# Patient Record
Sex: Male | Born: 1980 | Race: White | Hispanic: No | Marital: Single
Health system: Southern US, Community
[De-identification: ages and names within clinical notes are randomized; demographics above are authoritative.]

## PROBLEM LIST (undated history)

## (undated) DIAGNOSIS — I10 Essential (primary) hypertension: Secondary | ICD-10-CM

---

## 2004-06-03 ENCOUNTER — Inpatient Hospital Stay: Payer: Self-pay | Admitting: Anesthesiology

## 2005-02-19 ENCOUNTER — Inpatient Hospital Stay: Payer: Self-pay | Admitting: Unknown Physician Specialty

## 2006-09-22 ENCOUNTER — Emergency Department: Payer: Self-pay | Admitting: Emergency Medicine

## 2006-12-25 ENCOUNTER — Other Ambulatory Visit: Payer: Self-pay

## 2006-12-25 ENCOUNTER — Inpatient Hospital Stay: Payer: Self-pay | Admitting: Internal Medicine

## 2008-07-19 ENCOUNTER — Emergency Department: Payer: Self-pay | Admitting: Emergency Medicine

## 2009-07-08 ENCOUNTER — Emergency Department: Payer: Self-pay | Admitting: Emergency Medicine

## 2010-04-08 ENCOUNTER — Emergency Department: Payer: Self-pay | Admitting: Unknown Physician Specialty

## 2012-06-03 ENCOUNTER — Emergency Department: Payer: Self-pay | Admitting: Emergency Medicine

## 2012-06-03 LAB — COMPREHENSIVE METABOLIC PANEL
Alkaline Phosphatase: 96 U/L (ref 50–136)
Anion Gap: 8 (ref 7–16)
Bilirubin,Total: 0.4 mg/dL (ref 0.2–1.0)
Chloride: 95 mmol/L — ABNORMAL LOW (ref 98–107)
Co2: 29 mmol/L (ref 21–32)
Glucose: 97 mg/dL (ref 65–99)
Osmolality: 262 (ref 275–301)
Potassium: 3.2 mmol/L — ABNORMAL LOW (ref 3.5–5.1)
Sodium: 132 mmol/L — ABNORMAL LOW (ref 136–145)

## 2012-06-03 LAB — CBC WITH DIFFERENTIAL/PLATELET
Basophil #: 0.1 10*3/uL (ref 0.0–0.1)
Eosinophil #: 0 10*3/uL (ref 0.0–0.7)
Lymphocyte #: 1.7 10*3/uL (ref 1.0–3.6)
Lymphocyte %: 11.1 %
MCHC: 34.9 g/dL (ref 32.0–36.0)
MCV: 103 fL — ABNORMAL HIGH (ref 80–100)
Monocyte #: 1.4 x10 3/mm — ABNORMAL HIGH (ref 0.2–1.0)
Monocyte %: 9.5 %
Neutrophil #: 12 10*3/uL — ABNORMAL HIGH (ref 1.4–6.5)
Platelet: 211 10*3/uL (ref 150–440)

## 2012-06-03 LAB — PROTIME-INR: Prothrombin Time: 12.9 secs (ref 11.5–14.7)

## 2012-07-17 IMAGING — CR DG CHEST 1V PORT
1 series · 1 of 1 positions shown · non-contrast
Comparison: none

REASON FOR EXAM: vomiting abd pain
COMMENTS:

PROCEDURE:     DXR - DXR PORTABLE CHEST SINGLE VIEW  - April 08, 2010  [DATE]
RESULT:     Comparison: None

[view not recorded]
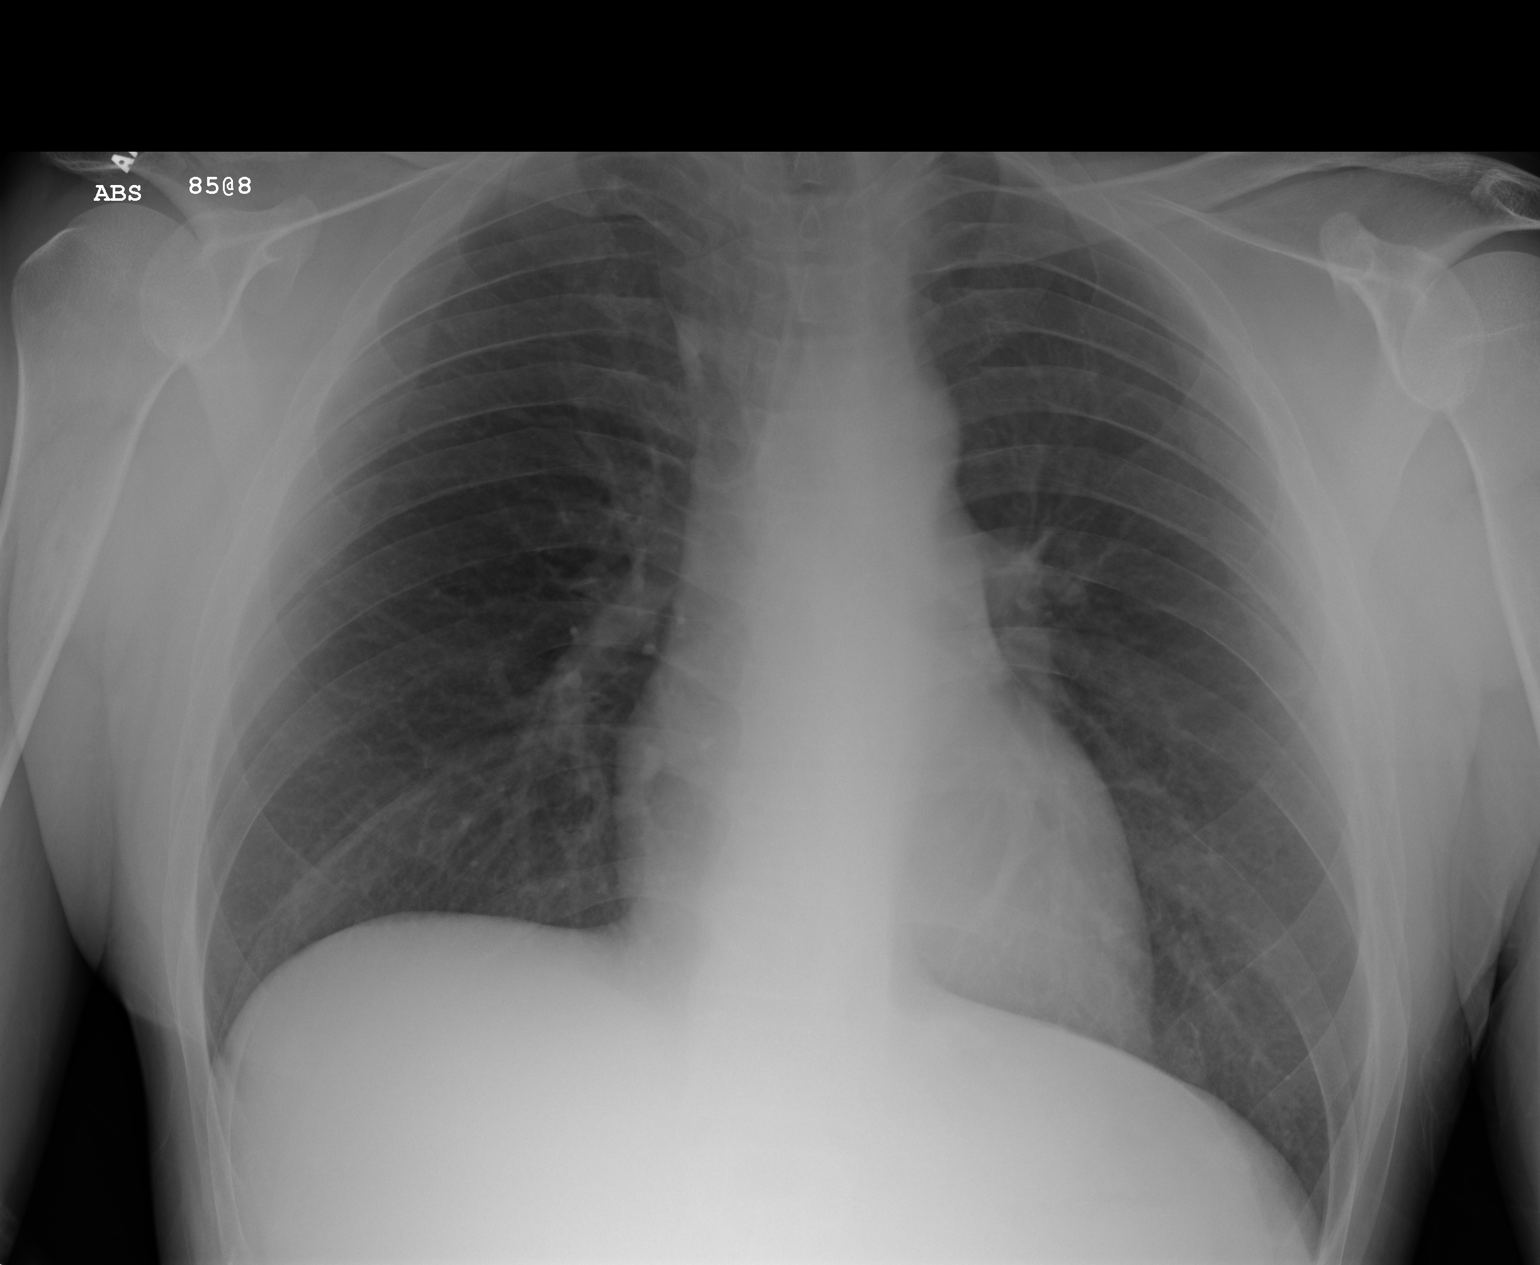

[1 of 1 positions shown; findings below may reference images not displayed]

FINDINGS: Single portable AP chest radiograph is provided.  There is no focal
parenchymal opacity, pleural effusion, or pneumothorax. Normal
cardiomediastinal silhouette. The osseous structures are unremarkable.
IMPRESSION: No acute disease of the chest.

## 2013-06-03 ENCOUNTER — Emergency Department: Payer: Self-pay | Admitting: Internal Medicine

## 2013-08-31 ENCOUNTER — Emergency Department: Payer: Self-pay | Admitting: Emergency Medicine

## 2013-08-31 LAB — URINALYSIS, COMPLETE
BACTERIA: NONE SEEN
BILIRUBIN, UR: NEGATIVE
BLOOD: NEGATIVE
GLUCOSE, UR: NEGATIVE mg/dL (ref 0–75)
Hyaline Cast: 1
Leukocyte Esterase: NEGATIVE
NITRITE: NEGATIVE
Ph: 7 (ref 4.5–8.0)
Protein: 100
RBC,UR: <1 /HPF (ref 0–5)
Specific Gravity: 1.03 (ref 1.003–1.030)
WBC UR: <1 /HPF (ref 0–5)

## 2013-08-31 LAB — CBC WITH DIFFERENTIAL/PLATELET
BASOS ABS: 0 10*3/uL (ref 0.0–0.1)
Basophil %: 0.4 %
EOS PCT: 0.4 %
Eosinophil #: 0 10*3/uL (ref 0.0–0.7)
HCT: 49.2 % (ref 40.0–52.0)
HGB: 16.8 g/dL (ref 13.0–18.0)
Lymphocyte #: 1 10*3/uL (ref 1.0–3.6)
Lymphocyte %: 10.9 %
MCH: 36.1 pg — ABNORMAL HIGH (ref 26.0–34.0)
MCHC: 34.2 g/dL (ref 32.0–36.0)
MCV: 105 fL — ABNORMAL HIGH (ref 80–100)
Monocyte #: 0.9 x10 3/mm (ref 0.2–1.0)
Monocyte %: 9.7 %
NEUTROS ABS: 7.5 10*3/uL — AB (ref 1.4–6.5)
NEUTROS PCT: 78.6 %
Platelet: 185 10*3/uL (ref 150–440)
RBC: 4.67 10*6/uL (ref 4.40–5.90)
RDW: 12.7 % (ref 11.5–14.5)
WBC: 9.5 10*3/uL (ref 3.8–10.6)

## 2013-08-31 LAB — COMPREHENSIVE METABOLIC PANEL
ALT: 45 U/L (ref 12–78)
ANION GAP: 3 — AB (ref 7–16)
Albumin: 4.4 g/dL (ref 3.4–5.0)
Alkaline Phosphatase: 124 U/L — ABNORMAL HIGH
BILIRUBIN TOTAL: 1.1 mg/dL — AB (ref 0.2–1.0)
BUN: 11 mg/dL (ref 7–18)
CO2: 32 mmol/L (ref 21–32)
CREATININE: 0.55 mg/dL — AB (ref 0.60–1.30)
Calcium, Total: 10 mg/dL (ref 8.5–10.1)
Chloride: 98 mmol/L (ref 98–107)
EGFR (African American): >60
EGFR (Non-African Amer.): >60
GLUCOSE: 98 mg/dL (ref 65–99)
Osmolality: 266 (ref 275–301)
POTASSIUM: 4.5 mmol/L (ref 3.5–5.1)
SGOT(AST): 60 U/L — ABNORMAL HIGH (ref 15–37)
SODIUM: 133 mmol/L — AB (ref 136–145)
Total Protein: 8.8 g/dL — ABNORMAL HIGH (ref 6.4–8.2)

## 2013-08-31 LAB — LIPASE, BLOOD: Lipase: 1351 U/L — ABNORMAL HIGH (ref 73–393)

## 2013-09-01 ENCOUNTER — Ambulatory Visit: Payer: Self-pay | Admitting: Internal Medicine

## 2013-09-01 LAB — PROTIME-INR
INR: 0.9
Prothrombin Time: 11.6 secs (ref 11.5–14.7)

## 2013-09-01 LAB — AFP TUMOR MARKER: AFP TUMOR MARKER: 2.3 ng/mL (ref 0.0–8.3)

## 2013-09-01 LAB — CEA: CEA: 4.7 ng/mL (ref 0.0–4.7)

## 2013-09-05 LAB — PROT IMMUNOELECTROPHORES(ARMC)

## 2013-09-09 ENCOUNTER — Ambulatory Visit: Payer: Self-pay | Admitting: Internal Medicine

## 2014-03-27 ENCOUNTER — Emergency Department: Payer: Self-pay | Admitting: Emergency Medicine

## 2014-03-27 LAB — CBC
HCT: 45.6 % (ref 40.0–52.0)
HGB: 15.5 g/dL (ref 13.0–18.0)
MCH: 36.4 pg — ABNORMAL HIGH (ref 26.0–34.0)
MCHC: 33.9 g/dL (ref 32.0–36.0)
MCV: 107 fL — AB (ref 80–100)
Platelet: 241 10*3/uL (ref 150–440)
RBC: 4.25 10*6/uL — AB (ref 4.40–5.90)
RDW: 12.6 % (ref 11.5–14.5)
WBC: 6.3 10*3/uL (ref 3.8–10.6)

## 2014-03-27 LAB — ACETAMINOPHEN LEVEL: Acetaminophen: 39 ug/mL — ABNORMAL HIGH

## 2014-03-27 LAB — COMPREHENSIVE METABOLIC PANEL
ALK PHOS: 98 U/L
ALT: 29 U/L
AST: 35 U/L (ref 15–37)
Albumin: 4.1 g/dL (ref 3.4–5.0)
Anion Gap: 8 (ref 7–16)
BILIRUBIN TOTAL: 0.4 mg/dL (ref 0.2–1.0)
BUN: 5 mg/dL — ABNORMAL LOW (ref 7–18)
CHLORIDE: 96 mmol/L — AB (ref 98–107)
CREATININE: 0.87 mg/dL (ref 0.60–1.30)
Calcium, Total: 8.1 mg/dL — ABNORMAL LOW (ref 8.5–10.1)
Co2: 26 mmol/L (ref 21–32)
EGFR (Non-African Amer.): >60
GLUCOSE: 88 mg/dL (ref 65–99)
OSMOLALITY: 257 (ref 275–301)
Potassium: 3.9 mmol/L (ref 3.5–5.1)
Sodium: 130 mmol/L — ABNORMAL LOW (ref 136–145)
TOTAL PROTEIN: 7.9 g/dL (ref 6.4–8.2)

## 2014-03-27 LAB — ETHANOL: ETHANOL LVL: 231 mg/dL

## 2014-03-27 LAB — SALICYLATE LEVEL: Salicylates, Serum: 3.2 mg/dL — ABNORMAL HIGH

## 2014-03-28 LAB — URINALYSIS, COMPLETE
Bacteria: NONE SEEN
Bilirubin,UR: NEGATIVE
Blood: NEGATIVE
GLUCOSE, UR: NEGATIVE mg/dL (ref 0–75)
Ketone: NEGATIVE
LEUKOCYTE ESTERASE: NEGATIVE
NITRITE: NEGATIVE
Ph: 6 (ref 4.5–8.0)
Protein: NEGATIVE
RBC,UR: NONE SEEN /HPF (ref 0–5)
SQUAMOUS EPITHELIAL: NONE SEEN
Specific Gravity: 1.002 (ref 1.003–1.030)
WBC UR: NONE SEEN /HPF (ref 0–5)

## 2014-03-28 LAB — DRUG SCREEN, URINE

## 2014-03-28 LAB — ACETAMINOPHEN LEVEL

## 2014-03-28 LAB — ETHANOL: Ethanol: 3 mg/dL

## 2015-09-12 IMAGING — CR DG CHEST 2V
1 series · 3 of 3 positions shown · non-contrast
Comparison: 06/03/2012

CLINICAL DATA: Cough and wheezing.  Weakness for 1 week.

EXAM:
CHEST  2 VIEW

[Series 1: pa · 0.17mm/px · 3 of 3 slices shown]
[im 1/3]
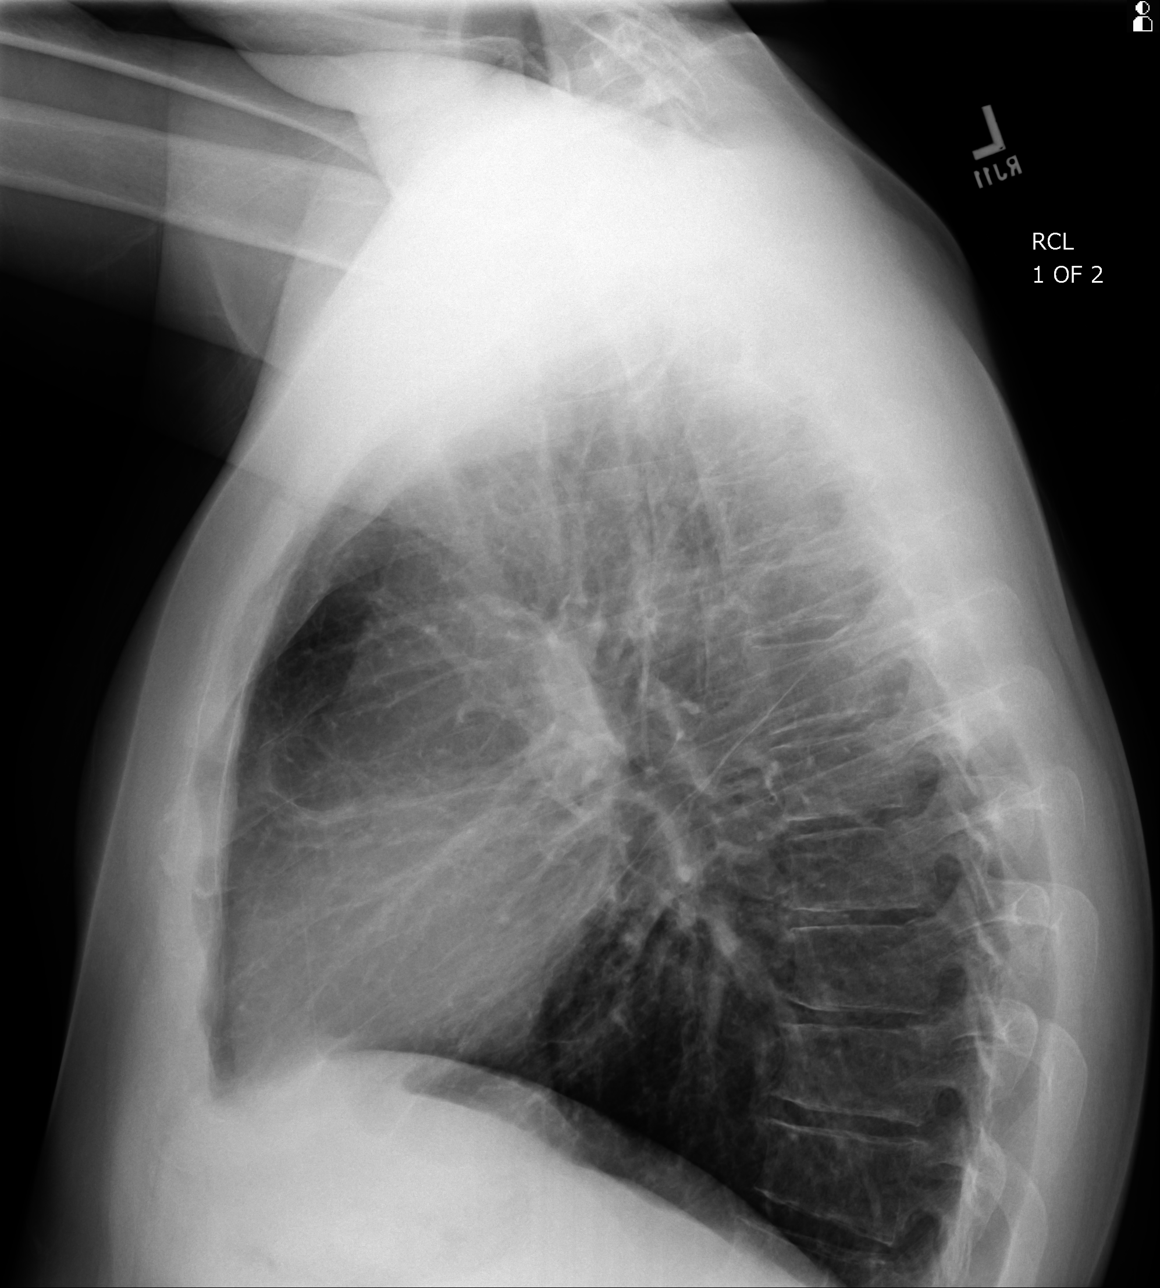
[im 2/3]
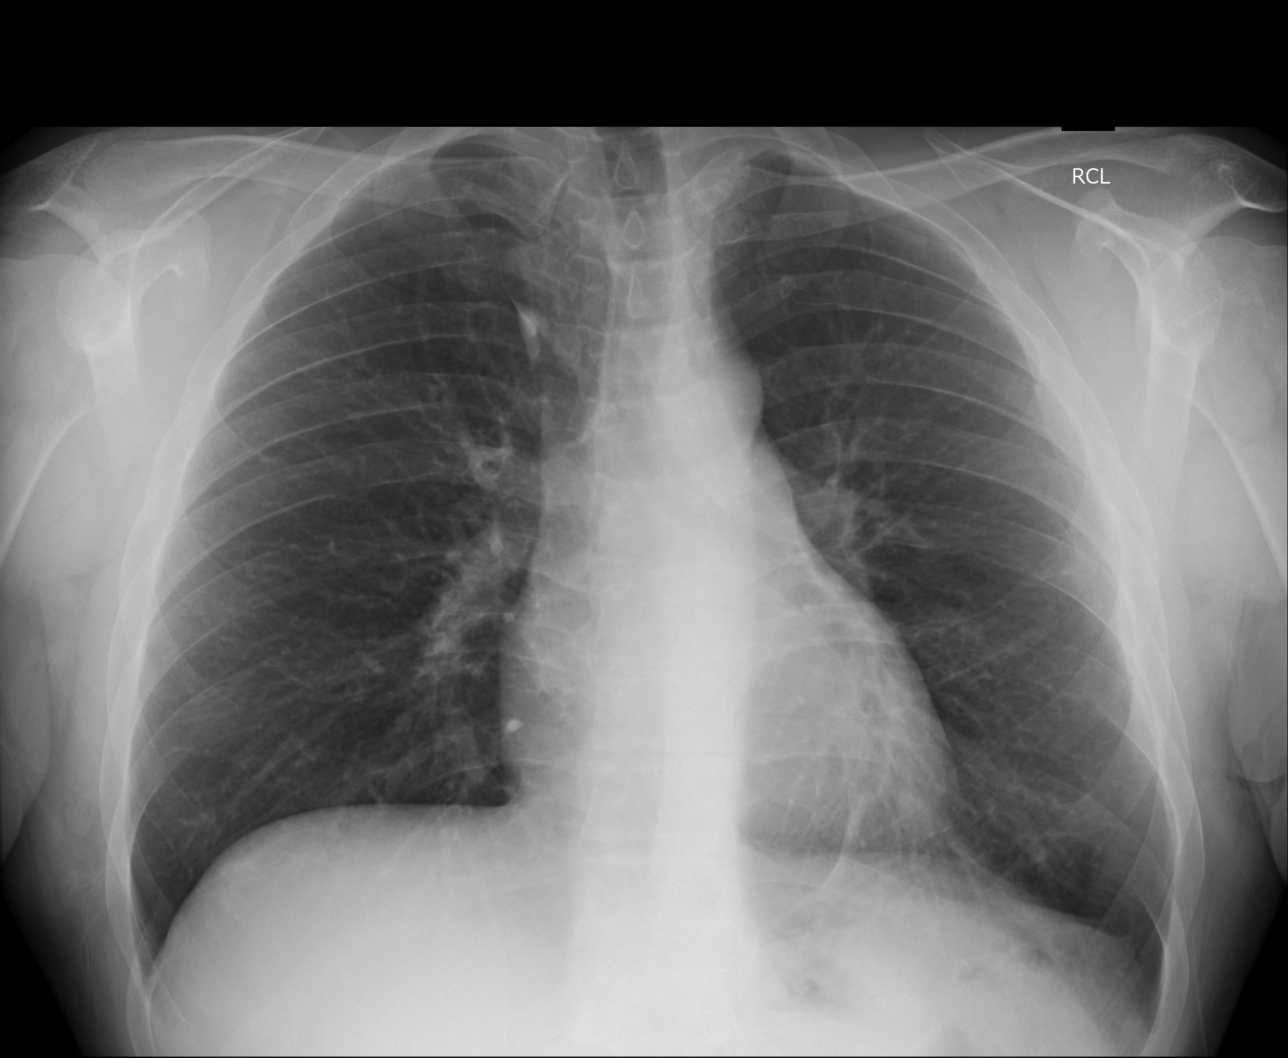
[im 3/3]
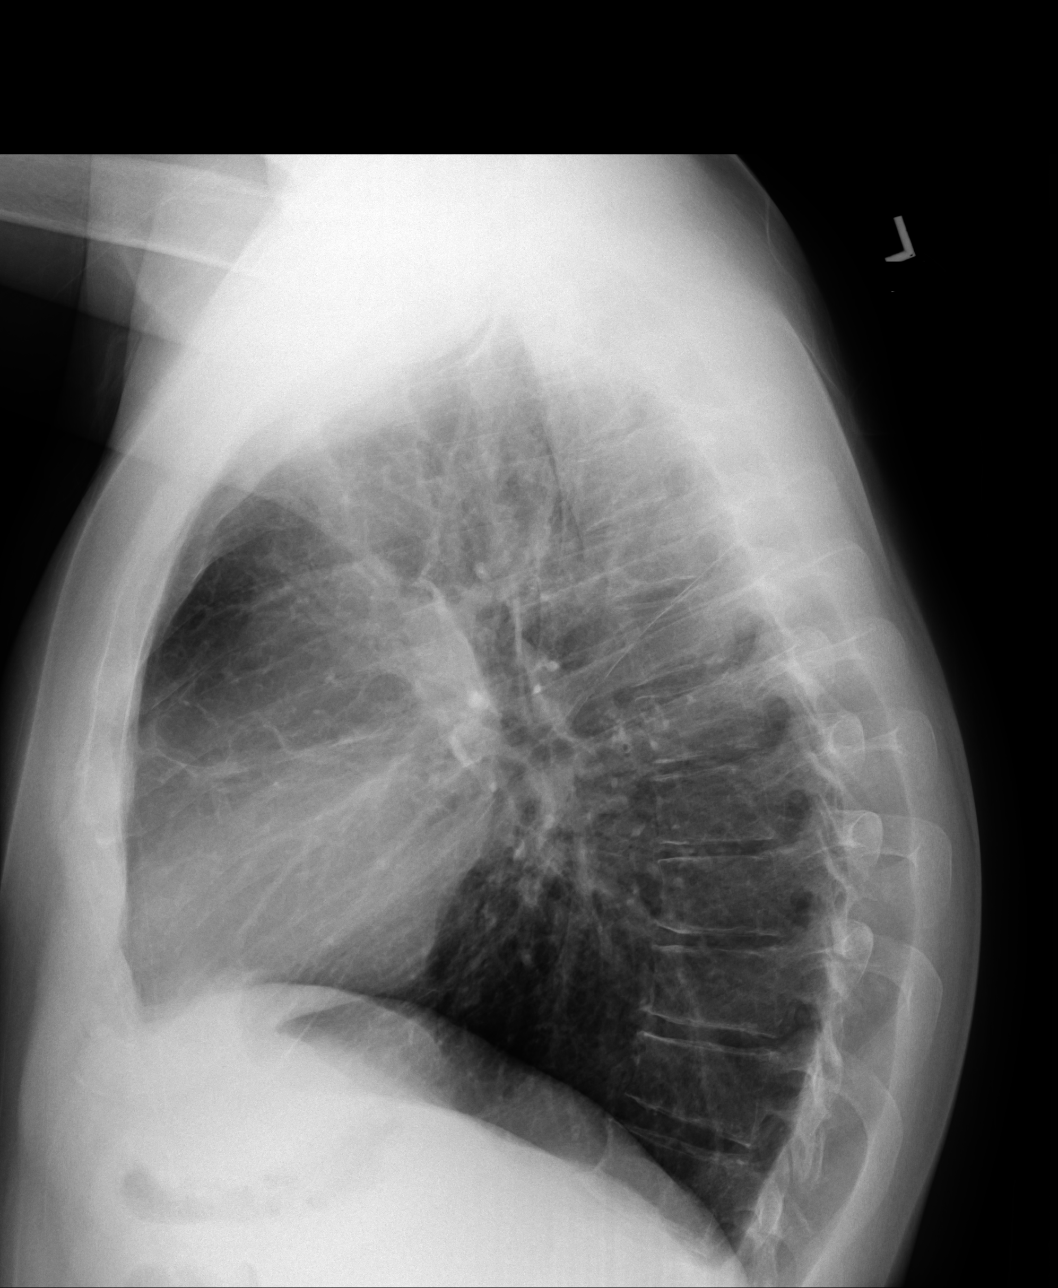

[3 of 3 positions shown; findings below may reference images not displayed]

FINDINGS: The cardiomediastinal silhouette is within normal limits. The lungs
are well inflated and clear. There is no evidence of pleural
effusion or pneumothorax. No acute osseous abnormality is
identified.
IMPRESSION: Clear lungs.

## 2015-12-29 ENCOUNTER — Emergency Department
Admission: EM | Admit: 2015-12-29 | Discharge: 2016-01-11 | Disposition: E | Payer: Self-pay | Attending: Emergency Medicine | Admitting: Emergency Medicine

## 2015-12-29 ENCOUNTER — Encounter: Payer: Self-pay | Admitting: Emergency Medicine

## 2015-12-29 DIAGNOSIS — I469 Cardiac arrest, cause unspecified: Secondary | ICD-10-CM | POA: Insufficient documentation

## 2015-12-29 HISTORY — DX: Essential (primary) hypertension: I10

## 2015-12-29 LAB — COMPREHENSIVE METABOLIC PANEL
ALBUMIN: 3.8 g/dL (ref 3.5–5.0)
ALK PHOS: 96 U/L (ref 38–126)
ALT: 32 U/L (ref 17–63)
ANION GAP: 13 (ref 5–15)
AST: 57 U/L — ABNORMAL HIGH (ref 15–41)
BUN: 12 mg/dL (ref 6–20)
CALCIUM: 8 mg/dL — AB (ref 8.9–10.3)
CHLORIDE: 107 mmol/L (ref 101–111)
CO2: 22 mmol/L (ref 22–32)
CREATININE: 1.05 mg/dL (ref 0.61–1.24)
GFR calc Af Amer: >60 mL/min (ref >60)
GFR calc non Af Amer: >60 mL/min (ref >60)
GLUCOSE: 267 mg/dL — AB (ref 65–99)
Potassium: 4 mmol/L (ref 3.5–5.1)
SODIUM: 142 mmol/L (ref 135–145)
Total Bilirubin: 0.8 mg/dL (ref 0.3–1.2)
Total Protein: 6.9 g/dL (ref 6.5–8.1)

## 2015-12-29 LAB — CBC
HCT: 56.3 % — ABNORMAL HIGH (ref 40.0–52.0)
HEMOGLOBIN: 18.3 g/dL — AB (ref 13.0–18.0)
MCH: 36.9 pg — ABNORMAL HIGH (ref 26.0–34.0)
MCHC: 32.6 g/dL (ref 32.0–36.0)
MCV: 113.2 fL — ABNORMAL HIGH (ref 80.0–100.0)
Platelets: 84 10*3/uL — ABNORMAL LOW (ref 150–440)
RBC: 4.97 MIL/uL (ref 4.40–5.90)
RDW: 13.2 % (ref 11.5–14.5)
WBC: 10 10*3/uL (ref 3.8–10.6)

## 2015-12-29 LAB — TROPONIN I: Troponin I: 0.07 ng/mL (ref <0.03)

## 2015-12-29 LAB — GLUCOSE, CAPILLARY: GLUCOSE-CAPILLARY: 186 mg/dL — AB (ref 65–99)

## 2015-12-29 MED ORDER — SODIUM BICARBONATE 8.4 % IV SOLN
INTRAVENOUS | Status: AC | PRN
  Administered 2015-12-29 (×2): 100 meq via INTRAVENOUS

## 2015-12-29 MED ORDER — SODIUM CHLORIDE 0.9 % IV SOLN
INTRAVENOUS | Status: AC | PRN
  Administered 2015-12-29: 1000 mL via INTRAVENOUS

## 2015-12-29 MED ORDER — CALCIUM CHLORIDE 10 % IV SOLN
INTRAVENOUS | Status: AC | PRN
  Administered 2015-12-29: 1 g via INTRAVENOUS

## 2015-12-29 MED ORDER — NALOXONE HCL 2 MG/2ML IJ SOSY
PREFILLED_SYRINGE | INTRAMUSCULAR | Status: AC | PRN
  Administered 2015-12-29: 2 mg via INTRAVENOUS

## 2015-12-29 MED ORDER — EPINEPHRINE HCL 0.1 MG/ML IJ SOSY
PREFILLED_SYRINGE | INTRAMUSCULAR | Status: AC | PRN
  Administered 2015-12-29 (×6): 1 via INTRAVENOUS

## 2015-12-31 MED FILL — Medication: Qty: 1 | Status: AC

## 2016-01-11 NOTE — Code Documentation (Signed)
Cardiac activity assessed via US by Dr. Lenard LancePaduchowski.  No cardiac activity at this time.

## 2016-01-11 NOTE — Code Documentation (Signed)
Patient time of death occurred at [id number].

## 2016-01-11 NOTE — ED Triage Notes (Signed)
Pt arrived via EMS after pt was found unresponsive and CPR initiated around 1213 by EMS and bystanders. Pt was at a convenience store and was report to have vomited prior to cardiac arrest.  Pt was alone at the time. When pt arrived to the ED, pt [facility]  King Airway was in place by EMS and pt was given 1 of Narcan, 1 of bicarb, and 6 doses of epi along with 3 shocks.  Pt was in asystole with EMS.  Unknown medical history.  Pt arrived in room 3 in ED at 1256.

## 2016-01-11 NOTE — Progress Notes (Signed)
Patient was received by EMS with  a King airway in place, good breath sounds bilaterally.  Patient was continually bagged with 100% oxygen until physician requested we stop and  time of death was pronounced.

## 2016-01-11 NOTE — Code Documentation (Signed)
Family updated as to patient's status.

## 2016-01-11 NOTE — ED Provider Notes (Addendum)
[facility] Emergency Department Provider Note  Time seen: 1:32 PM  I have reviewed the triage vital signs and the nursing notes.   HISTORY  Chief Complaint No chief complaint on file.    HPI Cody Haney is a 35 y.o. male with no known past medical history who presents the emergency department in cardiac arrest, CPR in progress. Per EMS report the patient was with a friend, they pulled over at a convenient store and the patient began vomiting, shortly after the patient lost consciousness, EMS was called. EMS states upon their arrival they found the patient to be unresponsive in asystole. A King airway was placed, CPR was initiated. EMS states 6 doses of epinephrine given, 2 defibrillations performed. They initially arrived at the scene at 12:13 PM. They brought the patient to the emergency department, [facility] [place] performing compressions.  No past medical history on file.  There are no active problems to display for this patient.   No past surgical history on file.  Prior to Admission medications   Not on File    Allergies not on file  No family history on file.  Social History Social History  Substance Use Topics  . Smoking status: Not on file  . Smokeless tobacco: Not on file  . Alcohol use Not on file    Review of Systems Able to obtain a review of systems due to unresponsiveness/cardiac arrest. ____________________________________________   PHYSICAL EXAM:  Constitutional: Unresponsive, Lucas device and King airway. IV access obtained by EMS. Eyes: 4 mm, fixed, nonreactive. ENT   Head: Normocephalic and atraumatic. Cardiovascular: Good pulse with compressions, no pulse while holding compressions. Asystole on monitor. Respiratory: King airway in place. Equal breath sounds bilaterally. Nothing heard over epigastrium. Gastrointestinal: Soft. No signs of trauma. Musculoskeletal: Extremities appear  atraumatic. Neurologic:  Unresponsive/cardiac arrest. Skin:  Skin is warm, dry and intact, good perfusion/color with CPR in progress. Psychiatric: Unresponsive/cardiac arrest  ____________________________________________    INITIAL IMPRESSION / ASSESSMENT AND PLAN / ED COURSE  Pertinent labs & imaging results that were available during my care of the patient were reviewed by me and considered in my medical decision making (see chart for details).  The patient presents the emergency department CPR in progress, unresponsive. Initial rhythm upon ER arrival was asystole. CPR was continued, multiple rounds of medications given including epinephrine, Narcan, sodium bicarbonate, and calcium. Patient had a King airway in place, good breath sounds bilaterally, nothing heard over the epigastrium. Initial ultrasound performed by myself shows no cardiac activity on 4 chamber view. CPR was continued for approximately 20 minutes in the emergency department with multiple rounds of medications given, never was there return of spontaneous circulation. Patient remained in asystole throughout his resuscitation attempt. Repeat bedside ultrasound once again shows no cardiac activity in 4 chamber view with continued asystole on the monitor. Time of death was pronounced at 1:16 PM.  Patient's mother and friend came to the emergency department. I discussed the unfortunate outcome with family and friends. Mom states the patient was complaining of chest pain this morning, but states he has complained about before. The friend also states he was complaining of chest pain this afternoon, they went to a convenience store, when the patient became very nauseated with dry heaving and then lost consciousness per friend.  EMS called immediately.  CRITICAL CARE Performed by: Minna AntisPADUCHOWSKI, Tevita Gomer   Total critical care time: 20 minutes  Critical care time was exclusive of separately billable procedures  and treating other  patients.  Critical care was necessary to treat or prevent imminent or life-threatening deterioration.  Critical care was time spent personally by me on the following activities: development of treatment plan with patient and/or surrogate as well as nursing, discussions with consultants, evaluation of patient's response to treatment, examination of patient, obtaining history from patient or surrogate, ordering and performing treatments and interventions, ordering and review of laboratory studies, ordering and review of radiographic studies, pulse oximetry and re-evaluation of patient's condition.   ____________________________________________   FINAL CLINICAL IMPRESSION(S) / ED DIAGNOSES  Cardiac arrest Resuscitation, unsuccessful    Minna AntisKevin Kevina Piloto, MD 03/10/2016 1341    Minna AntisKevin Kohana Amble, MD 03/10/2016 1342

## 2016-01-11 NOTE — ED Notes (Signed)
Family departed ED.  Given condolence tray.

## 2016-01-11 NOTE — ED Notes (Signed)
Family at bedside. 

## 2016-01-11 NOTE — Code Documentation (Signed)
Cardiac activity checked via US by Dr. Lenard LancePaduchowski, no cardiac activity present at this time. CPR continued.

## 2016-01-11 NOTE — ED Notes (Signed)
Transported to the morgue by orderly.

## 2016-01-11 DEATH — deceased

## 2016-01-31 MED ORDER — DEXTROSE 5 % IV SOLN
INTRAVENOUS | Status: AC
  Filled 2016-01-31: qty 500
